# Patient Record
Sex: Male | Born: 1975 | Race: White | Hispanic: No | Marital: Single | Smoking: Current every day smoker
Health system: Southern US, Community
[De-identification: ages and names within clinical notes are randomized; demographics above are authoritative.]

## PROBLEM LIST (undated history)

## (undated) HISTORY — PX: HAND SURGERY: SHX662

---

## 2014-06-24 ENCOUNTER — Ambulatory Visit: Payer: Self-pay | Admitting: Family Medicine

## 2014-07-03 ENCOUNTER — Encounter: Payer: Self-pay | Admitting: General Surgery

## 2014-07-03 ENCOUNTER — Ambulatory Visit (INDEPENDENT_AMBULATORY_CARE_PROVIDER_SITE_OTHER): Payer: 59 | Admitting: General Surgery

## 2014-07-03 VITALS — BP 132/82 | HR 82 | Resp 14

## 2014-07-03 DIAGNOSIS — K4090 Unilateral inguinal hernia, without obstruction or gangrene, not specified as recurrent: Secondary | ICD-10-CM

## 2014-07-03 NOTE — Patient Instructions (Addendum)

## 2014-07-03 NOTE — Progress Notes (Addendum)
Patient ID: Lawrence Cruz, male   DOB: Feb 28, 1976, 39 y.o.   MRN: [id number]  Chief Complaint  Patient presents with  . Hernia    HPI Lawrence Cruz is a 39 y.o. male.  Here today for evaluation of right inguinal hernia. States he has a "knot" in the right groin that has gotten larger over the past year. He was able to push it back in last week.  Mild pain. No gastrointestinal issues. Bowels move regular. He states it hurts worse with coughing.   HPI  History reviewed. No pertinent past medical history.  Past Surgical History  Procedure Laterality Date  . Hand surgery Right     History reviewed. No pertinent family history.  Social History History  Substance Use Topics  . Smoking status: Current Every Day Smoker -- 0.50 packs/day for .5 years    Types: Cigarettes  . Smokeless tobacco: Current User    Types: Chew  . Alcohol Use: 0.0 oz/week    0 Not specified per week     Comment: occasionally    Allergies  Allergen Reactions  . Penicillins     Current Outpatient Prescriptions  Medication Sig Dispense Refill  . meloxicam (MOBIC) 15 MG tablet Take 15 mg by mouth daily.     No current facility-administered medications for this visit.    Review of Systems Review of Systems  Constitutional: Negative.   Respiratory: Negative.   Cardiovascular: Negative.     Blood pressure 132/82, pulse 82, resp. rate 14.  Physical Exam Physical Exam  Constitutional: He is oriented to person, place, and time. He appears well-developed and well-nourished.  Eyes: Conjunctivae are normal. No scleral icterus.  Neck: Neck supple.  Cardiovascular: Normal rate, regular rhythm and normal heart sounds.   Pulmonary/Chest: Effort normal and breath sounds normal.  Abdominal: Soft. Normal appearance and bowel sounds are normal. There is no tenderness. A hernia is present. Hernia confirmed positive in the right inguinal area. Hernia confirmed negative in the left inguinal area.  Small to  medium size reducible right inguinal hernia.  Lymphadenopathy:    He has no cervical adenopathy.  Neurological: He is alert and oriented to person, place, and time.  Skin: Skin is warm and dry.    Data Reviewed Office notes.  Assessment    Symptomatic Right inguinal hernia.    Plan    Hernia precautions and incarceration were discussed with the patient. If they develop symptoms of an incarcerated hernia, they were encouraged to seek prompt medical attention.  I have recommended repair of the hernia using mesh on an outpatient basis in the near future. The risk of infection was reviewed. The role of prosthetic mesh to minimize the risk of recurrence was reviewed.     Patient's surgery has been scheduled for 07-08-2014 at [facility].  SANKAR,SEEPLAPUTHUR G 07/11/2014, 9:07 AM

## 2014-07-04 ENCOUNTER — Encounter: Payer: Self-pay | Admitting: General Surgery

## 2014-07-10 ENCOUNTER — Ambulatory Visit: Payer: Self-pay | Admitting: General Surgery

## 2014-07-10 DIAGNOSIS — K4090 Unilateral inguinal hernia, without obstruction or gangrene, not specified as recurrent: Secondary | ICD-10-CM

## 2014-07-10 HISTORY — PX: HERNIA REPAIR: SHX51

## 2014-07-11 ENCOUNTER — Ambulatory Visit: Payer: Self-pay | Admitting: General Surgery

## 2014-07-15 ENCOUNTER — Encounter: Payer: Self-pay | Admitting: General Surgery

## 2014-07-18 ENCOUNTER — Ambulatory Visit: Payer: Self-pay | Admitting: Pain Medicine

## 2014-07-23 ENCOUNTER — Encounter: Payer: Self-pay | Admitting: General Surgery

## 2014-07-23 ENCOUNTER — Ambulatory Visit (INDEPENDENT_AMBULATORY_CARE_PROVIDER_SITE_OTHER): Payer: Self-pay | Admitting: General Surgery

## 2014-07-23 VITALS — BP 120/76 | HR 76 | Resp 12 | Ht 74.0 in | Wt 213.0 lb

## 2014-07-23 DIAGNOSIS — K4090 Unilateral inguinal hernia, without obstruction or gangrene, not specified as recurrent: Secondary | ICD-10-CM

## 2014-07-23 NOTE — Progress Notes (Signed)
This is a 39 year old male here today for his post op right inguinal hernia repair done on 07/10/14. Patient states he is having some pain still. But he is moving about fairly well. Incision intact and looks clean and healing well. Repair is intact. May increase activity as tolerated starting next week. Patient to return in one month.

## 2014-07-23 NOTE — Patient Instructions (Signed)
Patient to return in one week.

## 2014-07-30 ENCOUNTER — Telehealth: Payer: Self-pay | Admitting: *Deleted

## 2014-07-30 NOTE — Telephone Encounter (Signed)
Pts friend called and was concerned about his bill, she stated that she helps take care of all his bills. She was wanting to know why his office visit was 235.00 and after insurance leaving them with a balance of 192.19. She thinks that is to "high" for just an office visit to tell him he has a hernia.

## 2014-08-20 ENCOUNTER — Ambulatory Visit: Payer: 59 | Admitting: General Surgery

## 2014-10-17 ENCOUNTER — Encounter: Payer: Self-pay | Admitting: *Deleted

## 2014-10-20 NOTE — Op Note (Signed)
PATIENT NAME:  Lawrence Cruz, Lawrence Cruz MR#:  [id number] DATE OF BIRTH:  09-01-1975  DATE OF PROCEDURE:  07/10/2014   PREOPERATIVE DIAGNOSIS: Right inguinal hernia.   POSTOPERATIVE DIAGNOSIS: Right inguinal hernia.   OPERATION: Repair right inguinal hernia with mesh.   SURGEON: Mckinley Jewel, MD   ANESTHESIA: Monitored anesthesia care using local anesthetic of 0.5% Marcaine and 1% Xylocaine and anesthesia sedation.    COMPLICATIONS: None.   ESTIMATED BLOOD LOSS: Minimal.   DRAINS: None.   DESCRIPTION OF PROCEDURE: The patient was placed in the supine position on the operating table.  With adequate sedation and monitoring, the right groin area was prepped and draped out as a sterile field and timeout was performed.  After the superior iliac spine and pubic tubercle were identified and marked an incision mapped along the medial two thirds. Local anesthetic was instilled to obtain an adequate block in this area. A skin incision was then made and deepened through the layers down to the external oblique.  Bleeding was controlled with cautery and ligatures of 3-0 Vicryl.  The inguinal canal was infiltrated with local anesthetic and the external oblique was opened along the line of its fibers through the external ring. Exploration revealed that the patient in fact had an indirect hernia which was freed, suture ligated with 2-0 Vicryl and removed.  The lipoma of the cord encountered which was separately freed, ligated with 3-0 Vicryl and removed.  There was no significant weakness of the posterior wall  encountered.  After ensuring proper hemostasis, the fascia covering the cord was reapproximated with 2-0 Vicryl.  The posterior wall satisfactorily exposed, Parietex ProGrip mesh was then placed around the cord and laid down against the posterior wall.  The lateral ends were tucked underneath the external oblique. The ilioinguinal nerve was located superior to the inguinal ring area and did not come into contact  with the hernial sac.  The medial end of the mesh was tacked to the pubic tubercle with 2 stitches of 2-0 PDS.  The wound was irrigated. The external oblique closed with 2-0 Vicryl running stitch. The subcutaneous tissue was closed with 3-0 Vicryl and the skin closed with subcuticular 4-0 Vicryl, covered with Dermabond.  Procedure was well tolerated.  He was subsequently reversed and returned to the recovery room in stable condition.     ____________________________ S.Robinette Haines, MD sgs:DT D: 07/15/2014 08:30:46 ET T: 07/15/2014 09:02:59 ET JOB#: [id number]  cc: Synthia Innocent. Jamal Collin, MD, <Dictator> [facility] Robinette Haines MD ELECTRONICALLY SIGNED 07/16/2014 13:16

## 2016-08-20 IMAGING — CR RIGHT HAND - COMPLETE 3+ VIEW
1 series · 3 of 3 positions shown · non-contrast
Comparison: None.

CLINICAL DATA: Bilateral metacarpophalangeal joints swelling for
3-4 years with no known injury, worse in the wintertime

EXAM:
RIGHT HAND - COMPLETE 3+ VIEW

[Series 1: pa · 0.17mm/px · 3 of 3 slices shown]
[im 1/3]
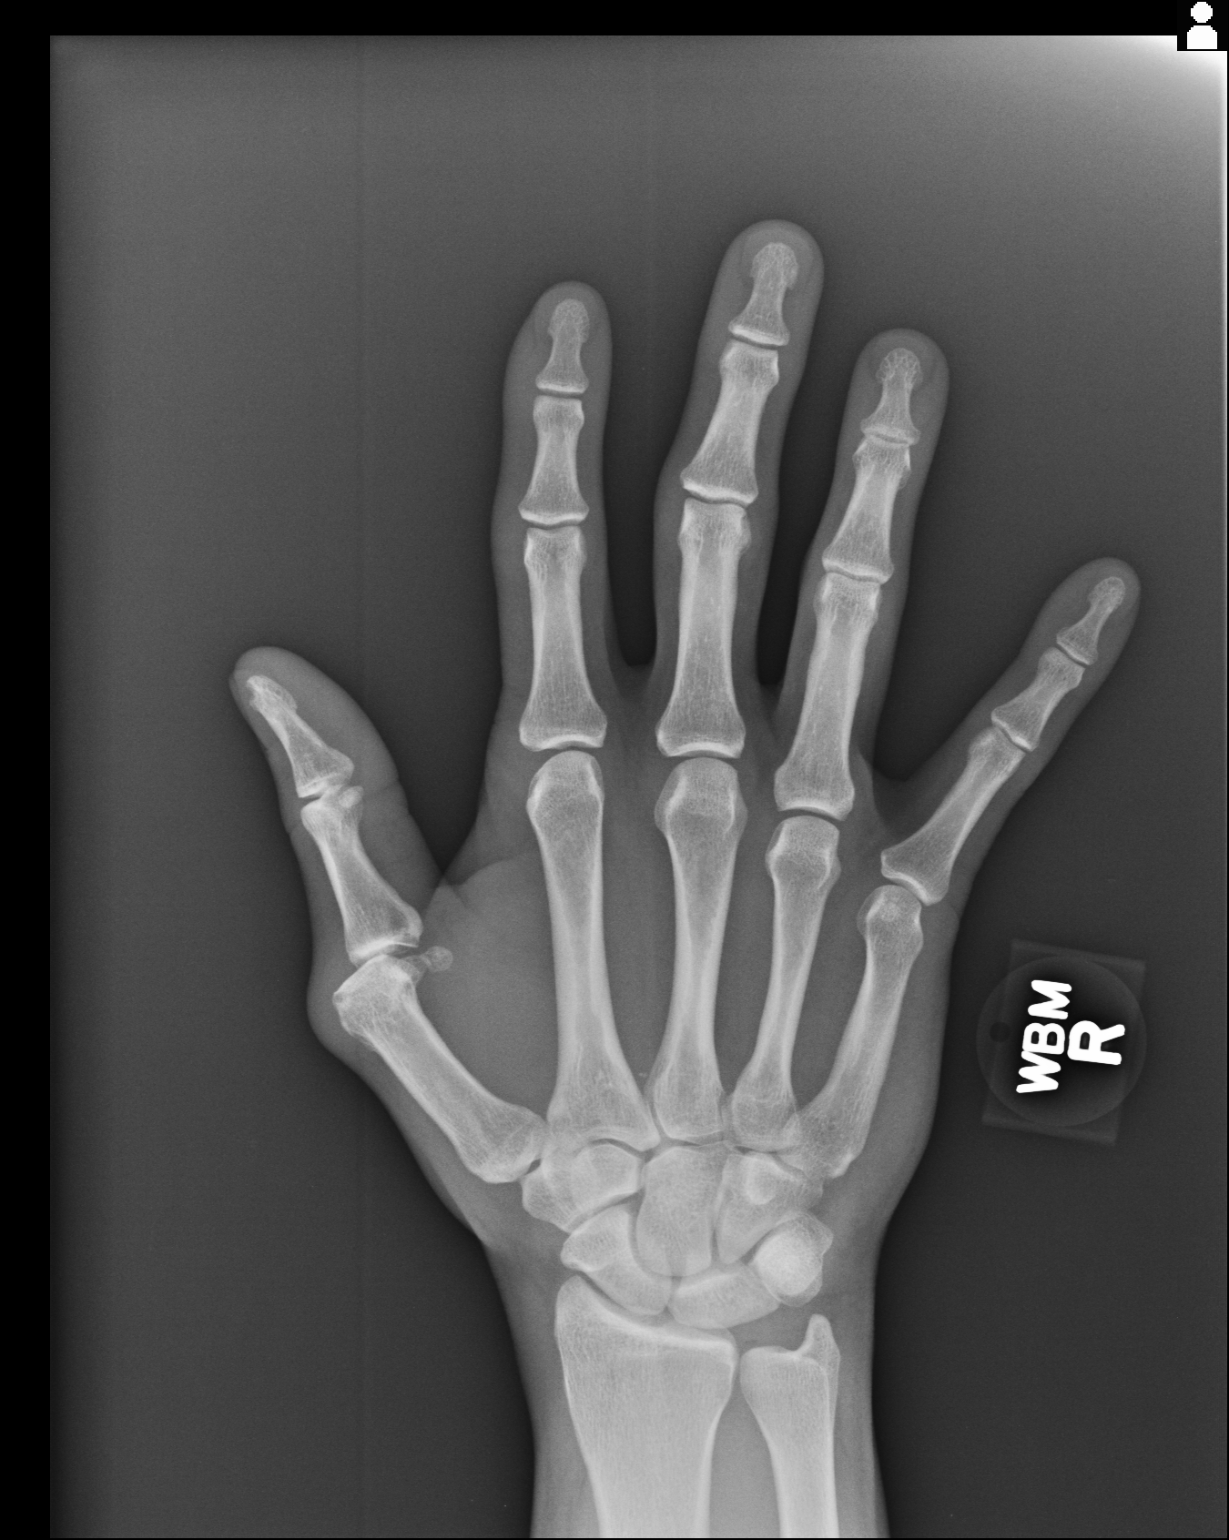
[im 2/3]
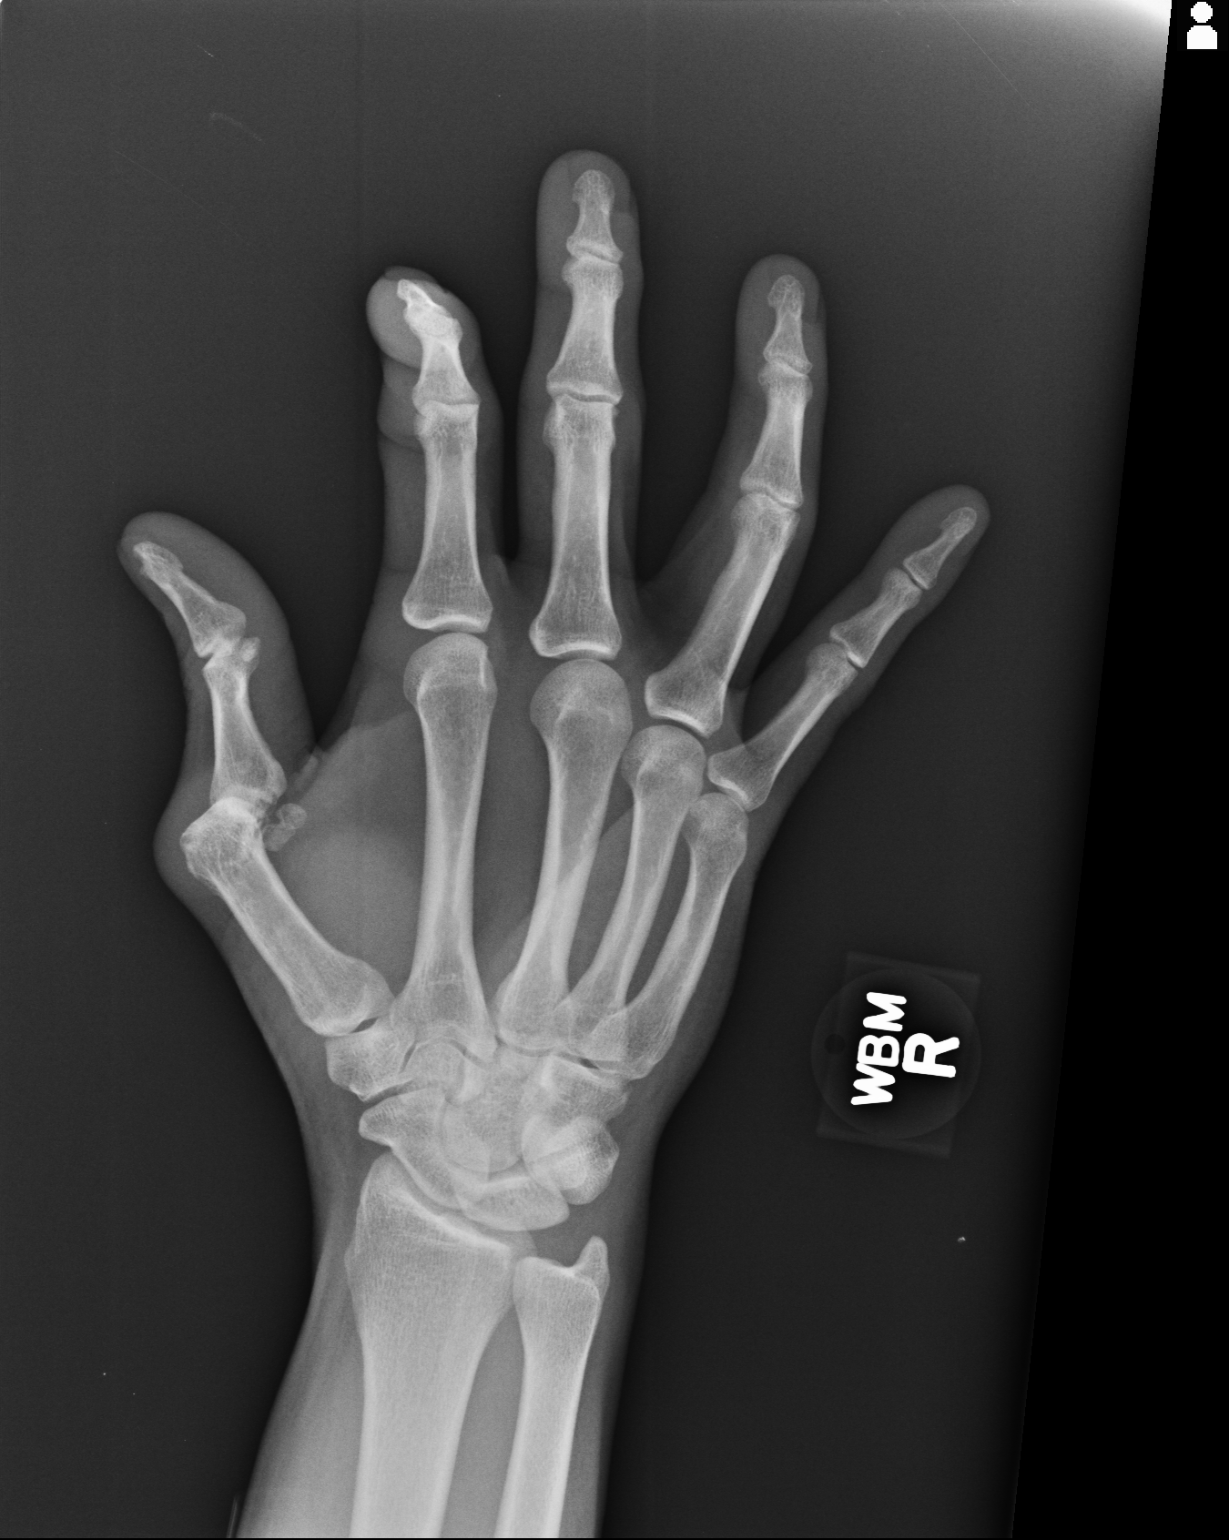
[im 3/3]
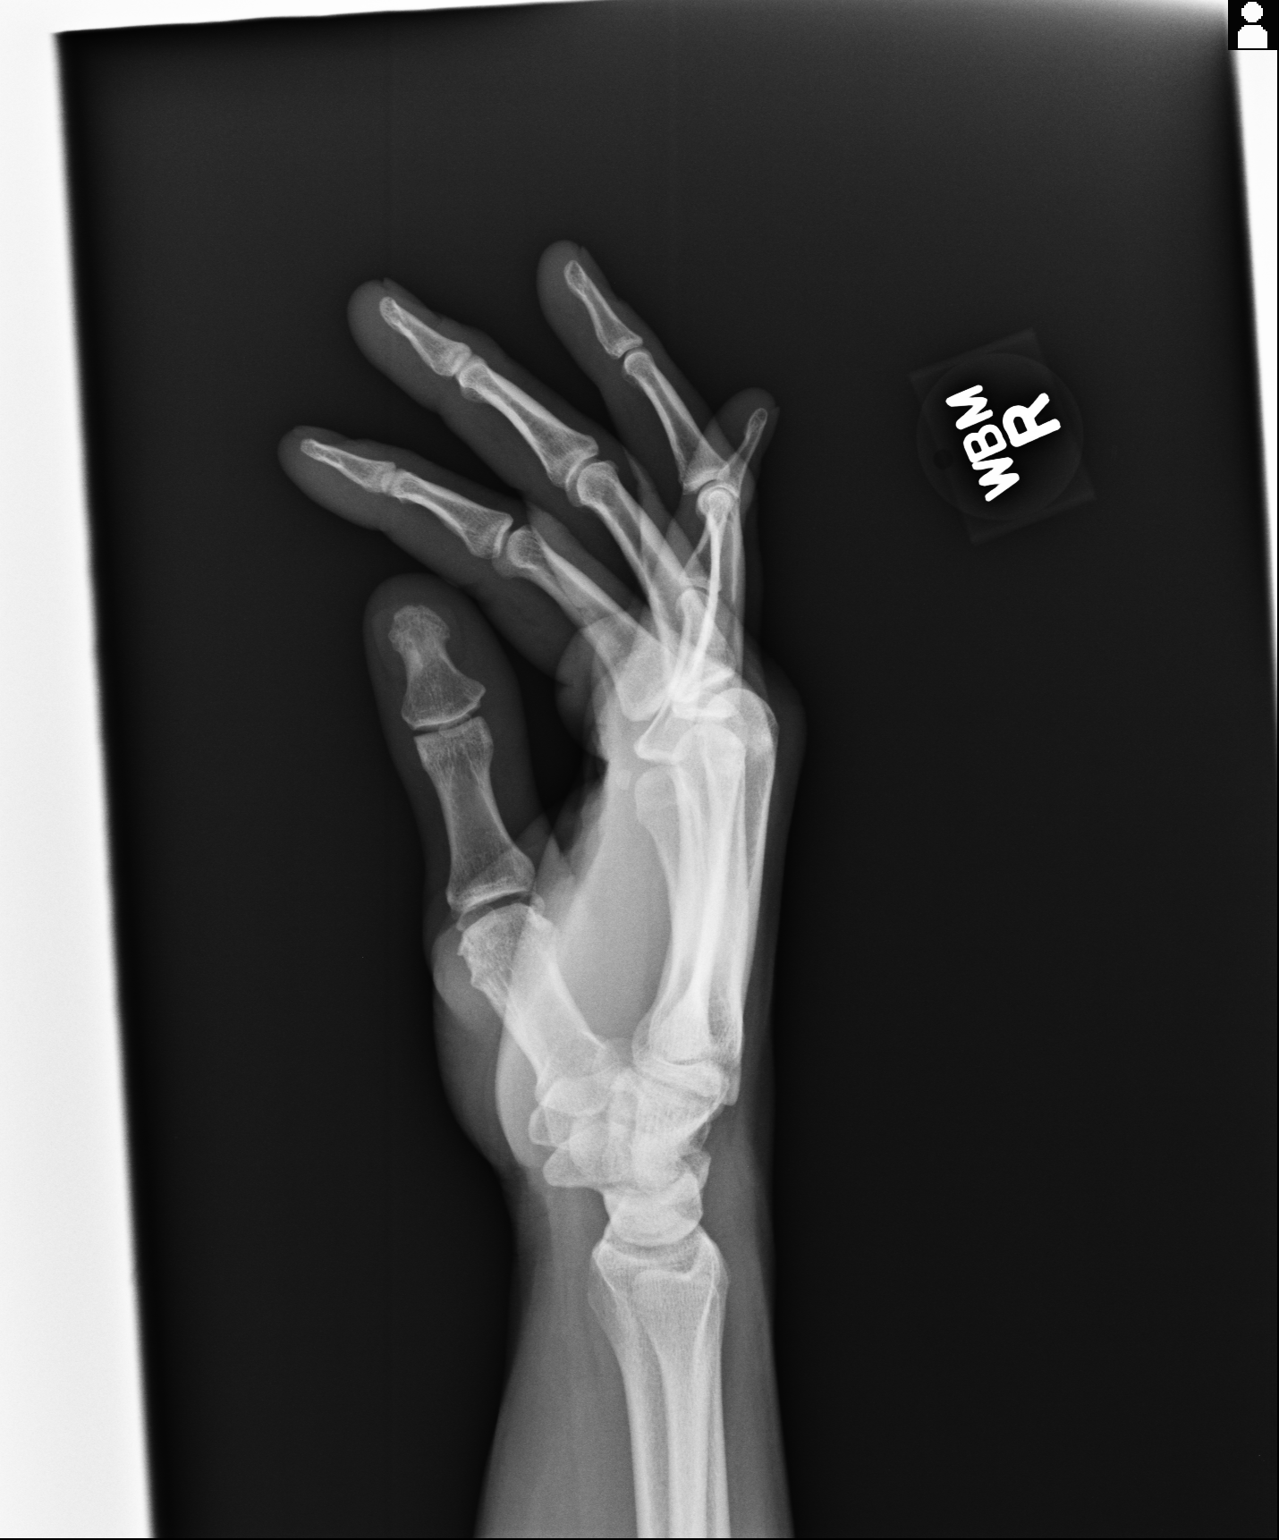

[3 of 3 positions shown; findings below may reference images not displayed]

FINDINGS: Mild joint space narrowing, sclerosis, and subchondral cystic change
of the first metacarpophalangeal joint with soft tissue swelling.
Study is otherwise negative. Mild osteophyte formation. Mild
persistent flexion at the joint.
IMPRESSION: Mild  arthritic change of the first carpal metacarpal joint.
